# Patient Record
Sex: Male | Born: 1990 | Race: Black or African American | Hispanic: No | Marital: Single
Health system: Southern US, Community
[De-identification: ages and names within clinical notes are randomized; demographics above are authoritative.]

---
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
## 2017-12-10 ENCOUNTER — Emergency Department (HOSPITAL_COMMUNITY): Payer: No Typology Code available for payment source

## 2017-12-10 ENCOUNTER — Emergency Department (HOSPITAL_COMMUNITY)
Admission: EM | Admit: 2017-12-10 | Discharge: 2017-12-10 | Disposition: A | Discharge status: Home / Self Care | Payer: No Typology Code available for payment source | Attending: Emergency Medicine | Admitting: Emergency Medicine

## 2017-12-10 ENCOUNTER — Encounter (HOSPITAL_COMMUNITY): Payer: Self-pay | Admitting: Nurse Practitioner

## 2017-12-10 DIAGNOSIS — Y939 Activity, unspecified: Secondary | ICD-10-CM | POA: Diagnosis not present

## 2017-12-10 DIAGNOSIS — M545 Low back pain: Secondary | ICD-10-CM | POA: Insufficient documentation

## 2017-12-10 DIAGNOSIS — Y929 Unspecified place or not applicable: Secondary | ICD-10-CM | POA: Insufficient documentation

## 2017-12-10 DIAGNOSIS — Y999 Unspecified external cause status: Secondary | ICD-10-CM | POA: Diagnosis not present

## 2017-12-10 MED ORDER — NAPROXEN 500 MG PO TABS: 500.0000 mg | ORAL_TABLET | Freq: Two times a day (BID) | ORAL | 0 refills | Status: AC

## 2017-12-10 MED ORDER — METHOCARBAMOL 500 MG PO TABS: 500.0000 mg | ORAL_TABLET | Freq: Three times a day (TID) | ORAL | 0 refills | Status: AC | PRN

## 2017-12-10 MED ORDER — OXYCODONE-ACETAMINOPHEN 5-325 MG PO TABS
1.0000 | ORAL_TABLET | Freq: Once | ORAL | Status: AC
  Administered 2017-12-10: 1 via ORAL
  Filled 2017-12-10: qty 1
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
## 2017-12-10 NOTE — ED Notes (Signed)
Bed: WTR8 Expected date:  Expected time:  Means of arrival:  Comments: 
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
## 2017-12-14 ENCOUNTER — Ambulatory Visit (HOSPITAL_COMMUNITY)
Admission: EM | Admit: 2017-12-14 | Discharge: 2017-12-14 | Disposition: A | Discharge status: Home / Self Care | Payer: Self-pay | Attending: Family Medicine | Admitting: Family Medicine

## 2017-12-14 ENCOUNTER — Encounter (HOSPITAL_COMMUNITY): Payer: Self-pay | Admitting: Emergency Medicine

## 2017-12-14 DIAGNOSIS — R69 Illness, unspecified: Secondary | ICD-10-CM

## 2017-12-14 DIAGNOSIS — J111 Influenza due to unidentified influenza virus with other respiratory manifestations: Secondary | ICD-10-CM

## 2017-12-14 MED ORDER — OSELTAMIVIR PHOSPHATE 75 MG PO CAPS: 75.0000 mg | ORAL_CAPSULE | Freq: Two times a day (BID) | ORAL | 0 refills | Status: AC

## 2017-12-14 NOTE — Discharge Instructions (Signed)

## 2017-12-14 NOTE — ED Triage Notes (Signed)
PT had a hospital visit 2/23 and 2/24 he started with flu symptoms.  PT reports headache, cough, congestion, soret throat, chest discomfort, body aches, and nausea.

## 2017-12-14 NOTE — ED Provider Notes (Addendum)
  [facility]   [id number] 12/14/17 Arrival Time: 1004  ASSESSMENT & PLAN:  1. Influenza-like illness    Meds ordered this encounter  Medications  . oseltamivir (TAMIFLU) 75 MG capsule    Sig: Take 1 capsule (75 mg total) by mouth 2 (two) times daily for 5 days.    Dispense:  10 capsule    Refill:  0   Work note given. Discussed typical duration of symptoms. OTC symptom care as needed. Ensure adequate fluid intake and rest. May f/u with PCP or here as needed.  Reviewed expectations re: course of current medical issues. Questions answered. Outlined signs and symptoms indicating need for more acute intervention. Patient verbalized understanding. After Visit Summary given.   SUBJECTIVE: History from: patient.  Dale [name] is a 27 y.o. male who presents with complaint of nasal congestion, post-nasal drainage, and a persistent dry cough. Onset abrupt, 1-2 days ago. Overall fatigued with body aches. SOB: none. Wheezing: none. Fever: questions subjective; some chills. Overall normal PO intake without n/v. Sick contacts: yes, girlfriend with the same. OTC treatment: none.  Received flu shot this year: no.  Non-smoker.  ROS: As per HPI.   OBJECTIVE:  Vitals:   12/14/17 1030 12/14/17 1031 12/14/17 1032  BP:   130/82  Pulse:  70   Resp:  16   Temp:  98.4 F (36.9 C)   TempSrc:  Oral   SpO2:  100%   Weight: 205 lb (93 kg)       General appearance: alert; appears fatigued HEENT: nasal congestion; clear runny nose; throat irritation secondary to post-nasal drainage Neck: supple without LAD Lungs: unlabored respirations, symmetrical air entry; cough: mild; no respiratory distress Skin: warm and dry Psychological: alert and cooperative; normal mood and affect  Imaging: No results found.  No Known Allergies   Social History   Socioeconomic History  . Marital status: Single    Spouse name: Not on file  . Number of children: Not on file  . Years of  education: Not on file  . Highest education level: Not on file  Social Needs  . Financial resource strain: Not on file  . Food insecurity - worry: Not on file  . Food insecurity - inability: Not on file  . Transportation needs - medical: Not on file  . Transportation needs - non-medical: Not on file  Occupational History  . Not on file  Tobacco Use  . Smoking status: Not on file  Substance and Sexual Activity  . Alcohol use: Not on file  . Drug use: Not on file  . Sexual activity: Not on file  Other Topics Concern  . Not on file  Social History Narrative  . Not on file           Mardella LaymanHagler, Bo Rogue, MD 12/14/17 1126    Mardella LaymanHagler, Shellene Sweigert, MD 12/14/17 1127    Mardella LaymanHagler, Aerilyn Slee, MD 12/14/17 [phone]

## 2017-12-21 ENCOUNTER — Encounter (HOSPITAL_COMMUNITY): Payer: Self-pay | Admitting: Emergency Medicine

## 2017-12-21 ENCOUNTER — Emergency Department (HOSPITAL_COMMUNITY)
Admission: EM | Admit: 2017-12-21 | Discharge: 2017-12-21 | Disposition: A | Discharge status: Home / Self Care | Payer: No Typology Code available for payment source | Attending: Emergency Medicine | Admitting: Emergency Medicine

## 2017-12-21 DIAGNOSIS — Z79899 Other long term (current) drug therapy: Secondary | ICD-10-CM | POA: Diagnosis not present

## 2017-12-21 DIAGNOSIS — S39012D Strain of muscle, fascia and tendon of lower back, subsequent encounter: Secondary | ICD-10-CM

## 2017-12-21 DIAGNOSIS — M545 Low back pain: Secondary | ICD-10-CM | POA: Diagnosis present

## 2017-12-21 MED ORDER — LIDOCAINE 5 % EX PTCH: 1.0000 | MEDICATED_PATCH | CUTANEOUS | 0 refills | Status: AC

## 2017-12-21 NOTE — Discharge Instructions (Signed)
Please read attached information regarding your condition and back exercises. Continue Tylenol and ibuprofen as prescribed.  Add on the Robaxin to help with muscle spasms. Stretch areas as tolerated. Use lidocaine patches in the area to help with discomfort. Follow-up with the wellness center listed below for further evaluation and to establish primary care. Return to ED for worsening back pain, injuries or falls, numbness in legs, loss of bowel or bladder function.
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
## 2017-12-21 NOTE — ED Notes (Signed)
Bed: WTR9 Expected date:  Expected time:  Means of arrival:  Comments: 
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
## 2017-12-29 ENCOUNTER — Inpatient Hospital Stay: Payer: Self-pay
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
